# Patient Record
Sex: Female | Born: 1990 | Race: Black or African American | Marital: Single | Smoking: Never smoker
Health system: Southern US, Community
[De-identification: ages and names within clinical notes are randomized; demographics above are authoritative.]

## PROBLEM LIST (undated history)

## (undated) ENCOUNTER — Inpatient Hospital Stay (HOSPITAL_COMMUNITY): Payer: BLUE CROSS/BLUE SHIELD

---
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
## 2012-12-04 ENCOUNTER — Ambulatory Visit: Payer: Self-pay | Admitting: Oncology

## 2012-12-04 LAB — CBC CANCER CENTER
Basophil %: 0.4 %
Lymphocyte #: 1.4 x10 3/mm (ref 1.0–3.6)
MCHC: 33.3 g/dL (ref 32.0–36.0)
MCV: 95 fL (ref 80–100)
Monocyte %: 8.4 %
Platelet: 173 x10 3/mm (ref 150–440)
RDW: 13.4 % (ref 11.5–14.5)

## 2012-12-04 LAB — BASIC METABOLIC PANEL
BUN: 9 mg/dL (ref 7–18)
Chloride: 103 mmol/L (ref 98–107)
Co2: 29 mmol/L (ref 21–32)
Creatinine: 0.67 mg/dL (ref 0.60–1.30)
EGFR (Non-African Amer.): >60
Osmolality: 277 (ref 275–301)
Potassium: 3.8 mmol/L (ref 3.5–5.1)

## 2012-12-24 ENCOUNTER — Ambulatory Visit: Payer: Self-pay | Admitting: Oncology

## 2013-12-06 ENCOUNTER — Ambulatory Visit: Payer: Self-pay | Admitting: Oncology

## 2013-12-06 LAB — CBC CANCER CENTER
BASOS ABS: 0 x10 3/mm (ref 0.0–0.1)
BASOS PCT: 0.6 %
Eosinophil #: 0.1 x10 3/mm (ref 0.0–0.7)
Eosinophil %: 1.1 %
HCT: 43.5 % (ref 35.0–47.0)
HGB: 14.5 g/dL (ref 12.0–16.0)
LYMPHS ABS: 1.5 x10 3/mm (ref 1.0–3.6)
LYMPHS PCT: 22.4 %
MCH: 30.4 pg (ref 26.0–34.0)
MCHC: 33.4 g/dL (ref 32.0–36.0)
MCV: 91 fL (ref 80–100)
MONOS PCT: 8.8 %
Monocyte #: 0.6 x10 3/mm (ref 0.2–0.9)
NEUTROS ABS: 4.5 x10 3/mm (ref 1.4–6.5)
NEUTROS PCT: 67.1 %
Platelet: 162 x10 3/mm (ref 150–440)
RBC: 4.79 10*6/uL (ref 3.80–5.20)
RDW: 13.7 % (ref 11.5–14.5)
WBC: 6.7 x10 3/mm (ref 3.6–11.0)

## 2013-12-06 LAB — T4, FREE: Free Thyroxine: 0.78 ng/dL (ref 0.76–1.46)

## 2013-12-06 LAB — BASIC METABOLIC PANEL
ANION GAP: 6 — AB (ref 7–16)
BUN: 10 mg/dL (ref 7–18)
CHLORIDE: 103 mmol/L (ref 98–107)
CREATININE: 0.67 mg/dL (ref 0.60–1.30)
Calcium, Total: 8.7 mg/dL (ref 8.5–10.1)
Co2: 31 mmol/L (ref 21–32)
EGFR (African American): >60
EGFR (Non-African Amer.): >60
Glucose: 99 mg/dL (ref 65–99)
Osmolality: 278 (ref 275–301)
POTASSIUM: 3.3 mmol/L — AB (ref 3.5–5.1)
Sodium: 140 mmol/L (ref 136–145)

## 2013-12-06 LAB — TSH: Thyroid Stimulating Horm: 5.42 u[IU]/mL — ABNORMAL HIGH

## 2013-12-24 ENCOUNTER — Ambulatory Visit: Payer: Self-pay | Admitting: Oncology

## 2014-12-17 ENCOUNTER — Other Ambulatory Visit: Payer: Self-pay | Admitting: *Deleted

## 2014-12-17 DIAGNOSIS — C119 Malignant neoplasm of nasopharynx, unspecified: Secondary | ICD-10-CM

## 2014-12-18 ENCOUNTER — Ambulatory Visit: Payer: Self-pay | Admitting: Oncology

## 2014-12-18 ENCOUNTER — Other Ambulatory Visit: Payer: Self-pay

## 2018-05-22 ENCOUNTER — Other Ambulatory Visit: Payer: Self-pay | Admitting: *Deleted

## 2018-05-22 DIAGNOSIS — Z95828 Presence of other vascular implants and grafts: Secondary | ICD-10-CM

## 2018-05-24 ENCOUNTER — Encounter: Payer: Self-pay | Admitting: Surgery

## 2019-10-22 ENCOUNTER — Telehealth: Payer: Self-pay | Admitting: General Practice

## 2019-10-22 NOTE — Telephone Encounter (Signed)
Called patient back, states she had a port a cath placed back in 2011 by Dr Evert Kohl. She has questions about her port of cath and if it had metal. States she moved to [place] and is "having a procedure done". Spoke to Dr Dahlia Byes in regards to this and states theres a possible chance of some metal due to it being now 10 years ago but doesn't want to say for sure because we do not know..  Advised pt of this and asked her to call Dr Virgel Manifold office to see if they were able to answer her question better. Pt voiced understanding and has no further questions.

## 2019-10-22 NOTE — Telephone Encounter (Signed)
Patient is calling and has questions about a surgery she had done in the past. Please call patient and advise.

## 2020-07-09 ENCOUNTER — Ambulatory Visit
Admission: EM | Admit: 2020-07-09 | Discharge: 2020-07-09 | Disposition: A | Discharge status: Home / Self Care | Payer: 59 | Attending: Family Medicine | Admitting: Family Medicine

## 2020-07-09 ENCOUNTER — Other Ambulatory Visit: Payer: Self-pay

## 2020-07-09 DIAGNOSIS — Z20822 Contact with and (suspected) exposure to covid-19: Secondary | ICD-10-CM | POA: Diagnosis not present

## 2020-07-09 NOTE — ED Triage Notes (Signed)
Pt had covid exposure, presents for covid testing. No symptoms.

## 2020-07-09 NOTE — Discharge Instructions (Signed)

## 2020-07-10 LAB — SARS CORONAVIRUS 2 (TAT 6-24 HRS): SARS Coronavirus 2: NEGATIVE

## 2020-07-12 ENCOUNTER — Other Ambulatory Visit: Payer: Self-pay

## 2020-07-12 ENCOUNTER — Encounter: Payer: Self-pay | Admitting: Emergency Medicine

## 2020-07-12 ENCOUNTER — Ambulatory Visit
Admission: EM | Admit: 2020-07-12 | Discharge: 2020-07-12 | Disposition: A | Discharge status: Home / Self Care | Payer: 59 | Attending: Family Medicine | Admitting: Family Medicine

## 2020-07-12 DIAGNOSIS — U071 COVID-19: Secondary | ICD-10-CM | POA: Insufficient documentation

## 2020-07-12 DIAGNOSIS — Z20822 Contact with and (suspected) exposure to covid-19: Secondary | ICD-10-CM | POA: Diagnosis not present

## 2020-07-12 NOTE — ED Triage Notes (Addendum)
Patient wants another COVID test even though her covid test on 07/09/20 was negative.  Patient states that she has been around more people that tested positive for covid.

## 2020-07-12 NOTE — Discharge Instructions (Signed)

## 2020-07-13 LAB — SARS CORONAVIRUS 2 (TAT 6-24 HRS): SARS Coronavirus 2: POSITIVE — AB

## 2022-02-16 ENCOUNTER — Telehealth: Payer: Self-pay

## 2022-02-16 NOTE — Telephone Encounter (Signed)
Patient has moved to [facility] and her new oncologist is asking for her initial pathology report and treatment history. Faxed information to michelle at [phone] Phone number is [phone]
